# Patient Record
Sex: Male | Born: 1950 | Race: Black or African American | Hispanic: No | Marital: Married | Smoking: Current every day smoker
Health system: Southern US, Community
[De-identification: ages and names within clinical notes are randomized; demographics above are authoritative.]

## PROBLEM LIST (undated history)

## (undated) DIAGNOSIS — I10 Essential (primary) hypertension: Secondary | ICD-10-CM

## (undated) DIAGNOSIS — R918 Other nonspecific abnormal finding of lung field: Secondary | ICD-10-CM

## (undated) DIAGNOSIS — F4310 Post-traumatic stress disorder, unspecified: Secondary | ICD-10-CM

## (undated) DIAGNOSIS — J439 Emphysema, unspecified: Secondary | ICD-10-CM

## (undated) DIAGNOSIS — F419 Anxiety disorder, unspecified: Secondary | ICD-10-CM

## (undated) DIAGNOSIS — E7800 Pure hypercholesterolemia, unspecified: Secondary | ICD-10-CM

## (undated) HISTORY — DX: Emphysema, unspecified: J43.9

## (undated) HISTORY — DX: Anxiety disorder, unspecified: F41.9

## (undated) HISTORY — PX: ESOPHAGOGASTRODUODENOSCOPY: SHX1529

## (undated) HISTORY — DX: Post-traumatic stress disorder, unspecified: F43.10

## (undated) HISTORY — DX: Other nonspecific abnormal finding of lung field: R91.8

## (undated) HISTORY — DX: Essential (primary) hypertension: I10

## (undated) HISTORY — DX: Pure hypercholesterolemia, unspecified: E78.00

## (undated) HISTORY — PX: COLONOSCOPY: SHX174

---

## 1983-11-21 HISTORY — PX: TIBIA FRACTURE SURGERY: SHX806

## 2002-06-17 ENCOUNTER — Encounter: Payer: Self-pay | Admitting: Internal Medicine

## 2002-06-17 ENCOUNTER — Ambulatory Visit (HOSPITAL_COMMUNITY): Admission: RE | Admit: 2002-06-17 | Discharge: 2002-06-17 | Payer: Self-pay | Admitting: Internal Medicine

## 2003-12-04 ENCOUNTER — Ambulatory Visit (HOSPITAL_COMMUNITY): Admission: RE | Admit: 2003-12-04 | Discharge: 2003-12-04 | Payer: Self-pay | Admitting: Gastroenterology

## 2003-12-18 ENCOUNTER — Encounter: Admission: RE | Admit: 2003-12-18 | Discharge: 2003-12-18 | Payer: Self-pay | Admitting: Gastroenterology

## 2013-11-02 ENCOUNTER — Encounter: Payer: Self-pay | Admitting: Nurse Practitioner

## 2013-11-02 NOTE — Progress Notes (Signed)
This encounter was created in error - please disregard.

## 2015-08-27 ENCOUNTER — Telehealth: Payer: Self-pay | Admitting: Pulmonary Disease

## 2015-08-27 ENCOUNTER — Encounter: Payer: Self-pay | Admitting: Pulmonary Disease

## 2015-08-27 ENCOUNTER — Ambulatory Visit (INDEPENDENT_AMBULATORY_CARE_PROVIDER_SITE_OTHER): Payer: Medicare Other | Admitting: Pulmonary Disease

## 2015-08-27 ENCOUNTER — Other Ambulatory Visit: Payer: Medicare Other

## 2015-08-27 VITALS — BP 138/84 | HR 77 | Ht 69.0 in | Wt 116.0 lb

## 2015-08-27 DIAGNOSIS — J438 Other emphysema: Secondary | ICD-10-CM

## 2015-08-27 DIAGNOSIS — F411 Generalized anxiety disorder: Secondary | ICD-10-CM | POA: Insufficient documentation

## 2015-08-27 DIAGNOSIS — F17200 Nicotine dependence, unspecified, uncomplicated: Secondary | ICD-10-CM

## 2015-08-27 DIAGNOSIS — I10 Essential (primary) hypertension: Secondary | ICD-10-CM | POA: Insufficient documentation

## 2015-08-27 DIAGNOSIS — R918 Other nonspecific abnormal finding of lung field: Secondary | ICD-10-CM | POA: Insufficient documentation

## 2015-08-27 DIAGNOSIS — J441 Chronic obstructive pulmonary disease with (acute) exacerbation: Secondary | ICD-10-CM

## 2015-08-27 DIAGNOSIS — E785 Hyperlipidemia, unspecified: Secondary | ICD-10-CM | POA: Insufficient documentation

## 2015-08-27 DIAGNOSIS — J439 Emphysema, unspecified: Secondary | ICD-10-CM | POA: Insufficient documentation

## 2015-08-27 DIAGNOSIS — F4310 Post-traumatic stress disorder, unspecified: Secondary | ICD-10-CM | POA: Insufficient documentation

## 2015-08-27 NOTE — Progress Notes (Signed)
Subjective:    Patient ID: Steven Ayers, male    DOB: 1951/01/21, 64 y.o.   MRN: [id number]  HPI He reports he had a routine physical on 07/21/15 and was found to have hematuria. He reports that during the workup he had a CT scan of his chest that showed lung nodules as well as cystic/emphysematous changes. No known TB exposure. He did do a tour of duty in [place]. He denies any noticeable dyspnea on exertion. He reports intermittent coughing productive of a "yellowish" phlegm. He denies any hemoptysis. He produces a couple of tablespoons of phlegm daily. He denies any wheezing. He denies any fever, chills, or sweats. He reports he has been at his current weight for 8-9 years. Denies any chest pain or pressure. No adenopathy in his neck, groin, or axilla recently. No sinus congestion, pressure, or drainage. He denies any illnesses when in the [facility] and was aware of the concern for [facility] Fever while he was there for 7 years.   Review of Systems No rashes or abnormal bruising. He denies any joint swelling, erythema, or stiffness. A pertinent 14 point review of systems is negative except as per the history of presenting illness.  No Known Allergies  No current outpatient prescriptions on file prior to visit.   No current facility-administered medications on file prior to visit.   Past Medical History  Diagnosis Date  . Hypertension   . Emphysema, unspecified ([place])   . High cholesterol   . Multiple lung nodules on CT   . Emphysema of lung ([place])   . Anxiety   . PTSD (post-traumatic stress disorder)     Past Surgical History  Procedure Laterality Date  . Tibia fracture surgery Right 1985  . Colonoscopy    . Esophagogastroduodenoscopy      Family History  Problem Relation Age of Onset  . Heart disease Brother   . Heart attack Brother   . Heart disease Paternal Grandmother   . Cancer Father     prostate  . Heart attack Father   . Cancer Maternal Aunt     breast  . Lung  disease Neg Hx    Social History   Social History  . Marital Status: Married    Spouse Name: N/A  . Number of Children: N/A  . Years of Education: N/A   Social History Main Topics  . Smoking status: Current Every Day Smoker -- 0.25 packs/day for 45 years    Types: Cigarettes    Start date: 10/14/1969  . Smokeless tobacco: None     Comment: Peak rate of 1ppd  . Alcohol Use: 0.0 oz/week    0 Standard drinks or equivalent per week     Comment: Rare social EtOH - 1 beer in last 6 months  . Drug Use: No  . Sexual Activity: Not Asked   Other Topics Concern  . None   Social History Narrative   Originally from near [place], [place]. He served in [employer] for 24 years. He was in [place] & [place]. No other international travel. 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 He has lived in [place], [place], [place], [place], [place], & [place]. No pets currently. No bird exposure. He had mold in his home that was professionally removed last year from his bedroom & near his bedroom windows. He reports they just used liquids to treat but didn't have  any invasive investigation. He denies any musty or damp smell recently. No hot tub exposure. He enjoys doing walks & doing activities with his church. 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      Objective:   Physical Exam Blood pressure 138/84, pulse 77, height 5\' 9"  (1.753 m), weight 116 lb (52.617 kg), SpO2 98 %. General:  Awake. Alert. No acute distress. Thin, African-American male.  Integument:  Warm & dry. No rash on exposed skin. No bruising. Lymphatics:  No appreciated cervical or supraclavicular lymphadenoapthy. HEENT:  Moist mucus membranes. No oral ulcers. No scleral injection or icterus. PERRL. Minimal bilateral nasal turbinate swelling. Cardiovascular:  Regular rate. No edema. No appreciable JVD.  Pulmonary:  Good  aeration & clear to auscultation bilaterally. Symmetric chest wall expansion. No accessory muscle use. Abdomen: Soft. Normal bowel sounds. Nondistended. Grossly nontender. Musculoskeletal:  Normal bulk and tone. Hand grip strength 5/5 bilaterally. No joint deformity or effusion appreciated. Neurological:  CN 2-12 grossly in tact. No meningismus. Moving all 4 extremities equally. Symmetric brachioradialis deep tendon reflexes. Psychiatric:  Mood and affect congruent. Speech normal rhythm, rate & tone.   IMAGING CT CHEST W/ 08/23/15 (personally reviewed by me): Extensive bilateral emphysematous changes with bleb formation. Bilateral sub-centimeter nodules noted. Thick walled bullae versus cyst in right upper lobe with adjacent opacity. No evidence for mycetoma. No pleural effusion or thickening. No pathologic mediastinal adenopathy appreciated. No evidence for chest wall invasion. No pericardial effusion.    Assessment & Plan:  64 year old male with significant emphysema as well as some thickening of the wall and a right upper lobe lab suspicious for an infectious component to the pulmonary nodules noted on his CT scan. Given his prior residence in [place] as well as the [place] fungal infections such as Blastomycosis, Histoplasma, & Coccidioides are of concern. I am less suspicious about reactivation tuberculosis given no notable prior exposure, but the patient did live in [place] during a tour of duty. We discussed his ongoing tobacco use at length as well as the need for complete cessation to prevent further injury to his lungs. I do believe it's reasonable to screen him for alpha-1 antitrypsin deficiency as well as probable underlying COPD given his chronic cough. I instructed the patient to contact my office if you have any further questions or concerns before his next appointment.  1. Bilateral pulmonary nodules: Plan to repeat CT scan of the chest without contrast in 4-6 weeks. 2. Emphysema:  Screening for alpha-1 antitrypsin deficiency. Checking full pulmonary function testing & 6 minute walk test. 3. Ongoing tobacco use: Patient counseled for over 3 minutes for complete tobacco cessation. 4. Cough: Checking sputum culture for AFB, fungus, and bacteria. Checking urine Histoplasma & Blastomyces antigens. Checking serum QuantiFERON-TB along with Coccidioides antibodies. Holding on antibiotic therapy at this time. 5. Patient to return to clinic in 4-6 weeks or sooner if needed.

## 2015-08-27 NOTE — Patient Instructions (Signed)
1. I'm doing blood and urine tests today to determine whether or not there may be a fungal infection contributing to the nodule seen on your CT scan. 2. We are checking you for the genetic form of COPD & emphysema today. 3. We will schedule breathing tests and a walking test patch her return visit to screen you for COPD. 4. I'm checking a culture of your sputum to see if we can identify what organism could be contributing to what I am seeing on her CT scan. 5. You will return to clinic in 4-6 weeks but please call my office if you have any further questions or concerns.

## 2015-08-27 NOTE — Addendum Note (Signed)
Addended by: Jerrol Banana on: 08/27/2015 03:46 PM   Modules accepted: Orders

## 2015-08-30 LAB — QUANTIFERON TB GOLD ASSAY (BLOOD)
INTERFERON GAMMA RELEASE ASSAY: NEGATIVE
QUANTIFERON NIL VALUE: 0.03 [IU]/mL
Quantiferon Tb Ag Minus Nil Value: 0.03 IU/mL
TB Ag value: 0.06 IU/mL

## 2015-08-30 LAB — HISTOPLASMA ANTIGEN, URINE

## 2015-08-30 LAB — COCCIDIOIDES ANTIBODIES: Coccidioides Ab CF: <1:2 {titer}

## 2015-08-30 NOTE — Telephone Encounter (Signed)
Called pt and appt scheduled for 12/9 w/ Dr. Ashok Cordia.

## 2015-08-30 NOTE — Telephone Encounter (Signed)
Called pt and ppt scheduled for 11/17

## 2015-08-30 NOTE — Telephone Encounter (Signed)
Can you wiggle him into the November schedule for 4-6 weeks? Thanks.

## 2015-08-31 ENCOUNTER — Other Ambulatory Visit: Payer: Medicare Other

## 2015-08-31 ENCOUNTER — Other Ambulatory Visit: Payer: Self-pay | Admitting: Pulmonary Disease

## 2015-08-31 DIAGNOSIS — J438 Other emphysema: Secondary | ICD-10-CM

## 2015-08-31 DIAGNOSIS — F17200 Nicotine dependence, unspecified, uncomplicated: Secondary | ICD-10-CM

## 2015-08-31 DIAGNOSIS — R042 Hemoptysis: Secondary | ICD-10-CM

## 2015-09-01 ENCOUNTER — Telehealth: Payer: Self-pay | Admitting: *Deleted

## 2015-09-01 LAB — ALPHA-1 ANTITRYPSIN PHENOTYPE: A1 ANTITRYPSIN: 179 mg/dL (ref 83–199)

## 2015-09-01 NOTE — Telephone Encounter (Signed)
[facility] called reports AFB showed 2+ positive.

## 2015-09-02 ENCOUNTER — Telehealth: Payer: Self-pay | Admitting: Pulmonary Disease

## 2015-09-02 LAB — RESPIRATORY CULTURE OR RESPIRATORY AND SPUTUM CULTURE
CULTURE: NORMAL
ORGANISM ID, BACTERIA: NORMAL

## 2015-09-02 NOTE — Telephone Encounter (Signed)
Called and spoke with [name] with [facility] Department she stated that she needed to discuss pt's critical lab results. I informed her that Dr. Ashok Cordia was out of the office and would return to the office on 10.19.2016. I explained to her that he is aware of results but would contact her when he was available. Mindy is aware of message.  Durene Cal [name] is requesting to speak with you at [phone] or [phone]

## 2015-09-02 NOTE — Telephone Encounter (Signed)
[name] returned call  [phone]

## 2015-09-02 NOTE — Telephone Encounter (Signed)
LVM on [phone] and [phone] for Steven Ayers with [facility] Department to return our call.

## 2015-09-03 ENCOUNTER — Telehealth: Payer: Self-pay | Admitting: Pulmonary Disease

## 2015-09-03 NOTE — Telephone Encounter (Signed)
Contacted by the [facility] regarding sputum AFB positivity. Explained to the representative that the patient's QuantiFERON TB is negative. Unlikely to represent tuberculosis especially without hemoptysis.

## 2015-09-06 NOTE — Telephone Encounter (Signed)
Was there a reason that this was sent back to me? I already spoke with her and documented. JN

## 2015-09-07 NOTE — Telephone Encounter (Signed)
Per 10.14.2016 phone note Steven Ayers has already spoken to the health dept. Nothing further needed at this time.

## 2015-09-10 ENCOUNTER — Telehealth: Payer: Self-pay | Admitting: Pulmonary Disease

## 2015-09-10 DIAGNOSIS — A310 Pulmonary mycobacterial infection: Secondary | ICD-10-CM

## 2015-09-10 NOTE — Telephone Encounter (Signed)
Received a critical call from lab for pt Steven Ayers stated that pt is positive for AFB and MAC Results are being faxed to office  Dr Ashok Cordia, please advise

## 2015-09-10 NOTE — Telephone Encounter (Signed)
Pt notified. Orders for labs entered. Nothing further needed.

## 2015-09-10 NOTE — Telephone Encounter (Signed)
Await sensitivities. Need to repeat a sputum culture for AFB, Fungus, & Bacteria. JN

## 2015-09-14 LAB — MVISTA BLASTOMYCES QNT AG, URINE
INTERPRETATION - MVISTA BLASTOMYCES QNT AG: NEGATIVE
Result (ng/ml): NOT DETECTED ng/mL

## 2015-09-28 LAB — FUNGUS CULTURE W SMEAR: SMEAR RESULT: NONE SEEN

## 2015-09-29 ENCOUNTER — Ambulatory Visit (INDEPENDENT_AMBULATORY_CARE_PROVIDER_SITE_OTHER): Payer: Medicare Other | Admitting: Pulmonary Disease

## 2015-09-29 DIAGNOSIS — J441 Chronic obstructive pulmonary disease with (acute) exacerbation: Secondary | ICD-10-CM

## 2015-09-29 DIAGNOSIS — R0600 Dyspnea, unspecified: Secondary | ICD-10-CM

## 2015-09-29 DIAGNOSIS — F17200 Nicotine dependence, unspecified, uncomplicated: Secondary | ICD-10-CM

## 2015-09-29 LAB — PULMONARY FUNCTION TEST
DL/VA % PRED: 67 %
DL/VA: 3.09 ml/min/mmHg/L
DLCO UNC % PRED: 39 %
DLCO unc: 12.63 ml/min/mmHg
FEF 25-75 Post: 1.98 L/sec
FEF 25-75 Pre: 1.07 L/sec
FEF2575-%CHANGE-POST: 84 %
FEF2575-%PRED-POST: 75 %
FEF2575-%PRED-PRE: 41 %
FEV1-%Change-Post: 20 %
FEV1-%PRED-POST: 86 %
FEV1-%Pred-Pre: 71 %
FEV1-PRE: 2.09 L
FEV1-Post: 2.52 L
FEV1FVC-%CHANGE-POST: 14 %
FEV1FVC-%Pred-Pre: 80 %
FEV6-%Change-Post: 5 %
FEV6-%PRED-PRE: 92 %
FEV6-%Pred-Post: 96 %
FEV6-PRE: 3.38 L
FEV6-Post: 3.55 L
FEV6FVC-%Change-Post: 0 %
FEV6FVC-%Pred-Post: 104 %
FEV6FVC-%Pred-Pre: 104 %
FVC-%CHANGE-POST: 4 %
FVC-%PRED-POST: 92 %
FVC-%PRED-PRE: 88 %
FVC-POST: 3.55 L
FVC-PRE: 3.39 L
POST FEV1/FVC RATIO: 71 %
POST FEV6/FVC RATIO: 100 %
PRE FEV1/FVC RATIO: 62 %
Pre FEV6/FVC Ratio: 100 %
RV % pred: 109 %
RV: 2.57 L
TLC % pred: 84 %
TLC: 5.84 L

## 2015-09-29 NOTE — Progress Notes (Signed)
PFT done today. 

## 2015-09-29 NOTE — Progress Notes (Signed)
PFT 09/29/15: FVC 3.39 L (88%) FEV1 2.09 L (71%) FEV1/FVC 0.62 FEF 25-75 1.07 L (41%) positive bronchodilator response TLC 5.84 L (84%) RV 109% ERV 264% DLCO uncorrected 39%  6MWT 09/29/15:  Walked 396 meters / Baseline Sat 96% on Ra / Nadir Sat 96% on RA @ rest  MICROBIOLOGY Sputum (08/31/15):  Mycobacerium Avium-Intracellulare Complex / Fungus negative / Oral Flora Urine Histoplasma Ag (08/27/15):  <0.5 Urine Blastomyces Ag (08/27/15):  Negative Coccidioides Antibodies:  <1:2 Quantiferon-TB:  Negative  LABS 08/27/15 Alpha-1 AT:  MM (179)

## 2015-09-30 ENCOUNTER — Ambulatory Visit (INDEPENDENT_AMBULATORY_CARE_PROVIDER_SITE_OTHER)
Admission: RE | Admit: 2015-09-30 | Discharge: 2015-09-30 | Disposition: A | Discharge status: Home / Self Care | Payer: Medicare Other | Source: Ambulatory Visit | Attending: Pulmonary Disease | Admitting: Pulmonary Disease

## 2015-09-30 DIAGNOSIS — F17200 Nicotine dependence, unspecified, uncomplicated: Secondary | ICD-10-CM | POA: Diagnosis not present

## 2015-09-30 DIAGNOSIS — R918 Other nonspecific abnormal finding of lung field: Secondary | ICD-10-CM

## 2015-10-07 ENCOUNTER — Ambulatory Visit: Payer: Medicare Other | Admitting: Pulmonary Disease

## 2015-10-07 ENCOUNTER — Telehealth: Payer: Self-pay | Admitting: Pulmonary Disease

## 2015-10-07 NOTE — Telephone Encounter (Signed)
Pt notified of results of cultures, PFTs, 6MWT, lab tests, & CT results. Plan to keep appointment @ 3pm on 12/9. Will; cancel his appointment for today. He reports he has minimal cough that is nonproductive at this time. He will notify me if his cough returns or he has any wheezing.

## 2015-10-15 LAB — AFB CULTURE WITH SMEAR (NOT AT ARMC)

## 2015-10-29 ENCOUNTER — Encounter: Payer: Self-pay | Admitting: Pulmonary Disease

## 2015-10-29 ENCOUNTER — Ambulatory Visit (INDEPENDENT_AMBULATORY_CARE_PROVIDER_SITE_OTHER): Payer: Medicare Other | Admitting: Pulmonary Disease

## 2015-10-29 VITALS — BP 110/70 | HR 102 | Ht 69.0 in | Wt 116.2 lb

## 2015-10-29 DIAGNOSIS — R918 Other nonspecific abnormal finding of lung field: Secondary | ICD-10-CM | POA: Diagnosis not present

## 2015-10-29 DIAGNOSIS — J449 Chronic obstructive pulmonary disease, unspecified: Secondary | ICD-10-CM

## 2015-10-29 DIAGNOSIS — J189 Pneumonia, unspecified organism: Secondary | ICD-10-CM

## 2015-10-29 DIAGNOSIS — F17200 Nicotine dependence, unspecified, uncomplicated: Secondary | ICD-10-CM | POA: Diagnosis not present

## 2015-10-29 DIAGNOSIS — J984 Other disorders of lung: Secondary | ICD-10-CM

## 2015-10-29 NOTE — Patient Instructions (Signed)
1. Please call me if your cough worsens in anyway or you experience any new symptoms. 2. We will repeat breathing tests at your next appointment with me. 3. I will see you back in 3-4 months or sooner if needed.

## 2015-10-29 NOTE — Progress Notes (Signed)
Subjective:    Patient ID: Steven Ayers, male    DOB: 07-06-1951, 64 y.o.   MRN: [id number]  C.C.:  Follow-up for Mild COPD w/ Emphysema, Pulmonary Nodules & Right Upper Lobe Cavity, & Ongoing Tobacco Use.   HPI Mild COPD w/ Emphysema: Alpha-1 antitrypsin was negative. Spirometry did show mild airways obstruction with a significant bronchodilator response however. No wheezing. Denies any significant dyspnea except with exertion.   Pulmonary Nodules & Right Upper Lobe Cavity: Sputum cultures yielded atypical mycobacteria x1. Cough had previously resolved. Denies any hemoptysis. Minimally productive of a yellow mucus now. Seen initially on CT imaging 08/2015 without evidence of progression on repeat imaging 4 weeks later.  Ongoing Tobacco Use:  He continues to try to cut back completely. He is smoking 1-2 cigarettes per day.  Review of Systems No chest pain or pressure. No fever, chills, or sweats.   No Known Allergies  Current Outpatient Prescriptions on File Prior to Visit  Medication Sig Dispense Refill  . aspirin 81 MG tablet Take 81 mg by mouth daily.    . BUPROPION HCL PO Take 1 tablet by mouth daily.    . fenofibrate (TRIGLIDE) 50 MG tablet Take 50 mg by mouth daily.    Marland Kitchen losartan (COZAAR) 100 MG tablet Take 100 mg by mouth daily.     No current facility-administered medications on file prior to visit.   Past Medical History  Diagnosis Date  . Hypertension   . Emphysema, unspecified ([place])   . High cholesterol   . Multiple lung nodules on CT   . Emphysema of lung ([place])   . Anxiety   . PTSD (post-traumatic stress disorder)     Past Surgical History  Procedure Laterality Date  . Tibia fracture surgery Right 1985  . Colonoscopy    . Esophagogastroduodenoscopy      Family History  Problem Relation Age of Onset  . Heart disease Brother   . Heart attack Brother   . Heart disease Paternal Grandmother   . Cancer Father     prostate  . Heart attack Father   .  Cancer Maternal Aunt     breast  . Lung disease Neg Hx    Social History   Social History  . Marital Status: Married    Spouse Name: N/A  . Number of Children: N/A  . Years of Education: N/A   Social History Main Topics  . Smoking status: Current Every Day Smoker -- 0.25 packs/day for 45 years    Types: Cigarettes    Start date: 10/14/1969  . Smokeless tobacco: None     Comment: Peak rate of 1ppd  . Alcohol Use: 0.0 oz/week    0 Standard drinks or equivalent per week     Comment: Rare social EtOH - 1 beer in last 6 months  . Drug Use: No  . Sexual Activity: Not Asked   Other Topics Concern  . None   Social History Narrative   Originally from near [place], [place]. He served in [employer] for 24 years. He was in [place] & [place]. No other international travel. 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 He has lived in [place], [place], [place], [place], [place], & [place]. No pets currently. No bird exposure. He had mold in his home that was professionally removed last year from his bedroom & near his bedroom windows. He reports they  just used liquids to treat but didn't have any invasive investigation. He denies any musty or damp smell recently. No hot tub exposure. He enjoys doing walks & doing activities with his church. 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      Objective:   Physical Exam BP 110/70 mmHg  Pulse 102  Ht 5\' 9"  (1.753 m)  Wt 116 lb 3.2 oz (52.708 kg)  BMI 17.15 kg/m2  SpO2 98% General:  Thin, African-American male. No distress. Appears comfortable sitting in chair. Integument:  Warm & dry. No rash on exposed skin. No bruising on exposed skin. HEENT:  Moist mucus membranes. No oral ulcers. Mild bilateral nasal turbinate swelling. Cardiovascular:  Regular rate. No edema. Normal S1 & S2.  Pulmonary:  Clear bilaterally to auscultation. Symmetric  chest wall expansion. No accessory muscle use on room air. Abdomen: Soft. Normal bowel sounds. Nondistended. Grossly nontender. Musculoskeletal:  Normal bulk and tone. Hand grip strength 5/5 bilaterally. No joint deformity or effusion appreciated.  PFT 09/29/15: FVC 3.39 L (88%) FEV1 2.09 L (71%) FEV1/FVC 0.62 FEF 25-75 1.07 L (41%) positive bronchodilator response TLC 5.84 L (84%) RV 109% ERV 264% DLCO uncorrected 39%  6MWT 09/29/15: Walked 396 meters / Baseline Sat 96% on Ra / Nadir Sat 96% on RA @ rest  IMAGING CT CHEST W/O 09/30/15 (personally reviewed by me): Persistent bilateral emphysematous changes of bleb formation. Bilateral pulmonary nodules unchanged. No pleural effusion or thickening. No pathologic mediastinal adenopathy. Thick walled cavity in right upper lobe is unchanged without evidence of air-fluid level and suspicion for abscess versus infected bulla. Associated pleural thickening unchanged as well.  CT CHEST W/ 08/23/15 (previously reviewed by me): Extensive bilateral emphysematous changes with bleb formation. Bilateral sub-centimeter nodules noted. Thick walled bullae versus cyst in right upper lobe with adjacent opacity. No evidence for mycetoma. No pleural effusion or thickening. No pathologic mediastinal adenopathy appreciated. No evidence for chest wall invasion. No pericardial effusion.  MICROBIOLOGY Sputum (08/31/15): Mycobacerium Avium-Intracellulare Complex / Fungus negative / Oral Flora Urine Histoplasma Ag (08/27/15): <0.5 Urine Blastomyces Ag (08/27/15): Negative Coccidioides Antibodies: <1:2 Quantiferon-TB: Negative  LABS 08/27/15 Alpha-1 AT: MM (179)    Assessment & Plan:  64 year old male with bolus emphysema as well as mild COPD based on spirometry with a significant bronchodilator response. I reviewed the patient's CT imaging from last month as well as lab studies performed and microbiology. The patient has no evidence for alpha-1 antitrypsin  deficiency. It's likely that his COPD and emphysema are caused by his ongoing tobacco use. We did spend over 3 minutes discussing the need for complete tobacco cessation which the patient is attempting with Wellbutrin. The cavitary area in his right upper lobe appears stable. We discussed potential investigation and treatment options including bronchoscopy as well as repeat sputum culture and following with imaging. I explained that I cannot completely rule out the potential for underlying malignancy but this is slightly less likely given lack of progression on imaging. At this time the patient prefers to continue to monitor for symptoms and avoid further aggressive testing or investigation. I instructed the patient contact my office if he had any new breathing problems before his next appointment.  1. Mild COPD with bullous emphysema: Holding on initiating inhaler therapies at this time given lack of symptoms. Repeat spirometry with bronchodilator challenge at next appointment and if worsening consider initiation of inhaled medications. 2. Lung nodules with right upper lobe cavity: Suspicious for atypical infection with MAC  given prior sputum culture result. Patient declines further culture or imaging at this time. Monitoring for worsening or recurrence of symptoms. 3. Tobacco use: Ongoing. Patient taking Wellbutrin. Attempting to quit on his own. Spent over 3 minutes counseling the patient on the need for complete tobacco cessation. 4. Health maintenance: Patient prefers to get the influenza & pneumococcal vaccines through the [place] system. 5. Follow-up: Patient to return to clinic in 3-4 months or sooner if needed.

## 2016-07-20 ENCOUNTER — Other Ambulatory Visit: Payer: Self-pay | Admitting: Internal Medicine

## 2016-07-20 DIAGNOSIS — Z136 Encounter for screening for cardiovascular disorders: Secondary | ICD-10-CM

## 2016-08-02 ENCOUNTER — Ambulatory Visit
Admission: RE | Admit: 2016-08-02 | Discharge: 2016-08-02 | Disposition: A | Discharge status: Home / Self Care | Payer: Medicare Other | Source: Ambulatory Visit | Attending: Internal Medicine | Admitting: Internal Medicine

## 2016-08-02 DIAGNOSIS — Z136 Encounter for screening for cardiovascular disorders: Secondary | ICD-10-CM

## 2016-09-06 ENCOUNTER — Other Ambulatory Visit: Payer: Self-pay | Admitting: Urology

## 2016-09-06 DIAGNOSIS — R972 Elevated prostate specific antigen [PSA]: Secondary | ICD-10-CM

## 2016-10-18 IMAGING — CT CT CHEST W/O CM
2 of 3 series · 15 of 36 positions shown, 18 images · IV contrast (Omnipaque 300)
Comparison: CT of the chest 08/23/2015.

CLINICAL DATA: Abnormal CT of the chest.  Cavitary lesion.

EXAM:
CT CHEST WITHOUT CONTRAST
TECHNIQUE: Multidetector CT imaging of the chest was performed following the
standard protocol without IV contrast.

[Series 2: chest routine with · axial · 0.62mm/px · z∈[-300,-10]mm · 12 of 68 slices shown, 15 images]
[im 5/68  mediastinal]
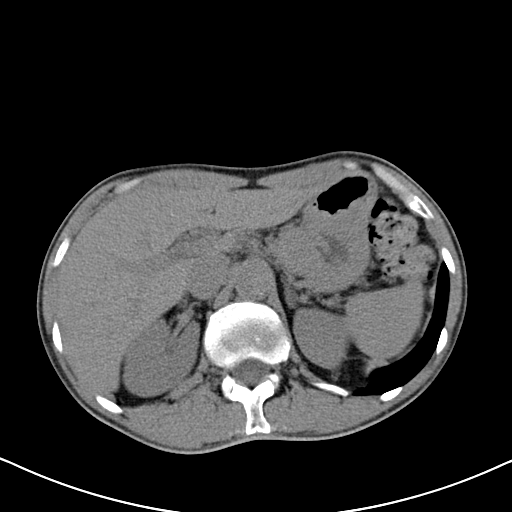
[im 5/68  lung]
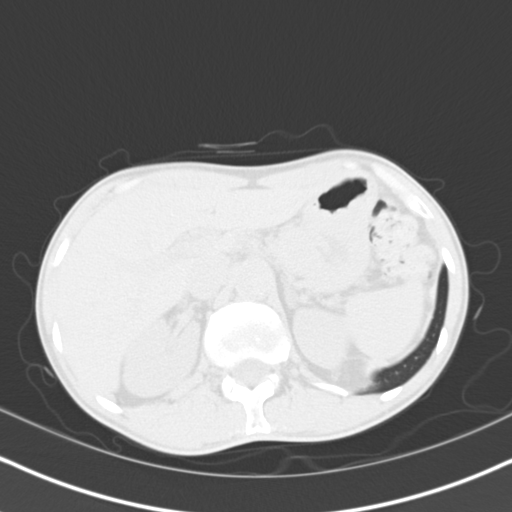
[im 10/68  lung]
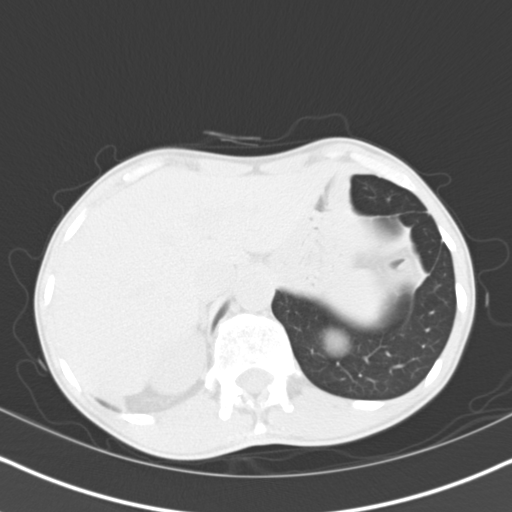
[im 15/68  lung]
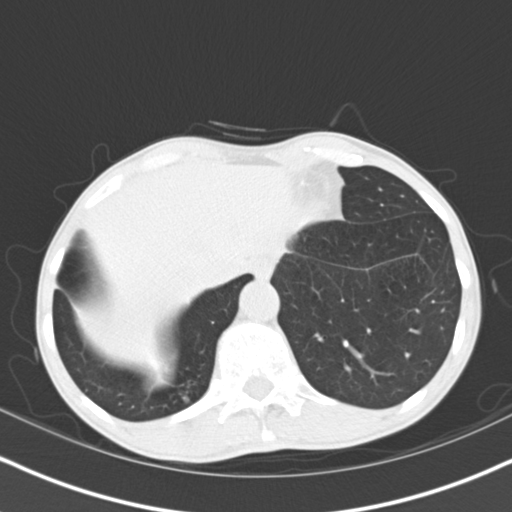
[im 20/68  lung]
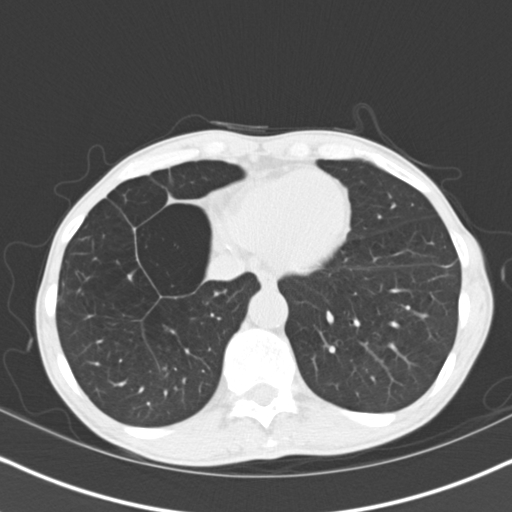
[im 25/68  mediastinal]
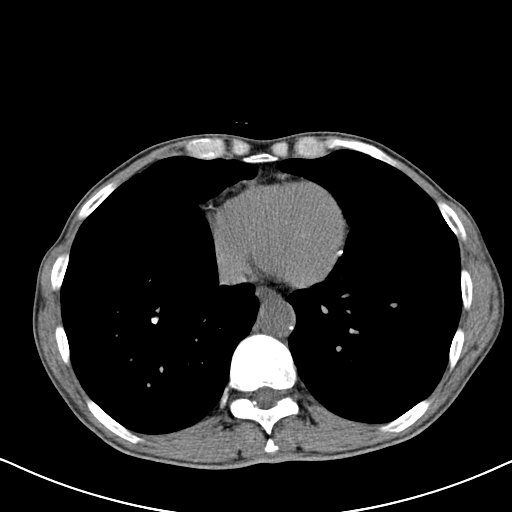
[im 25/68  lung]
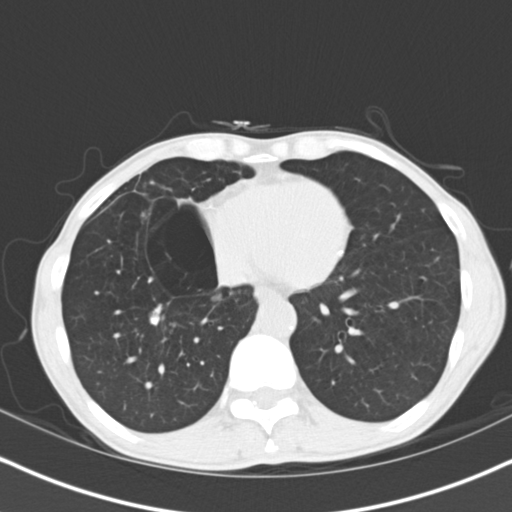
[im 30/68  lung]
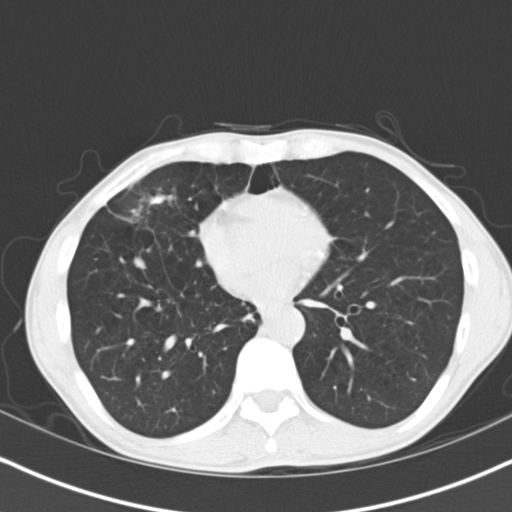
[im 38/68  lung]
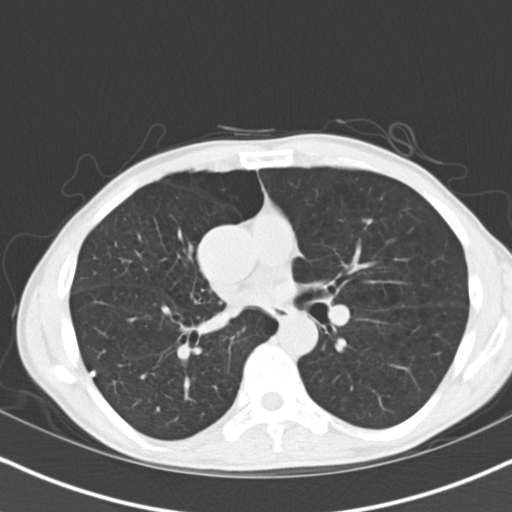
[im 43/68  lung]
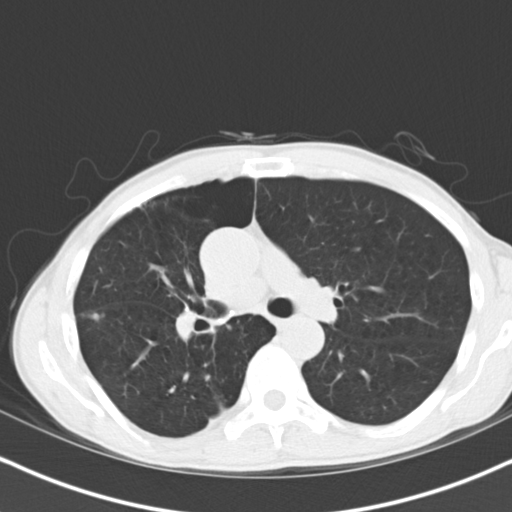
[im 48/68  mediastinal]
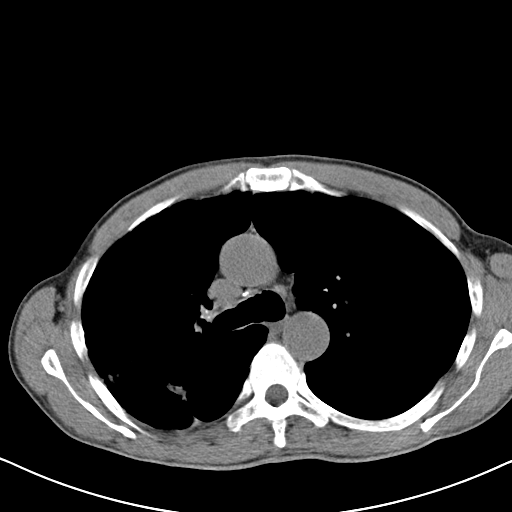
[im 48/68  lung]
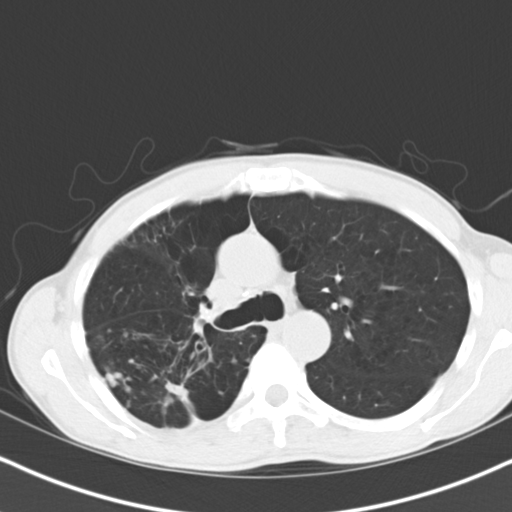
[im 53/68  lung]
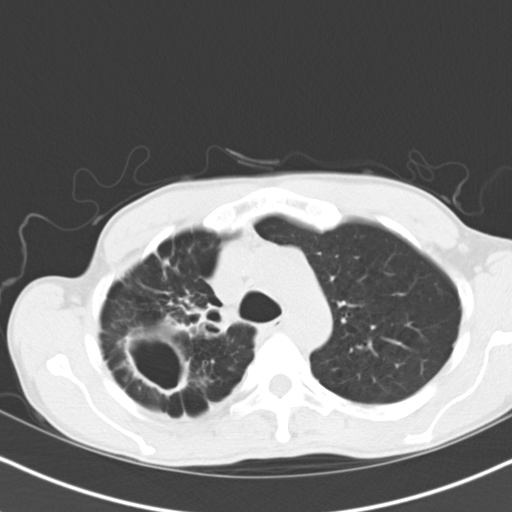
[im 58/68  lung]
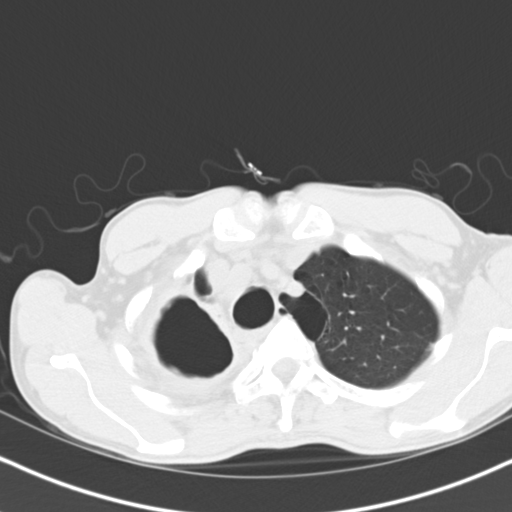
[im 63/68  lung]
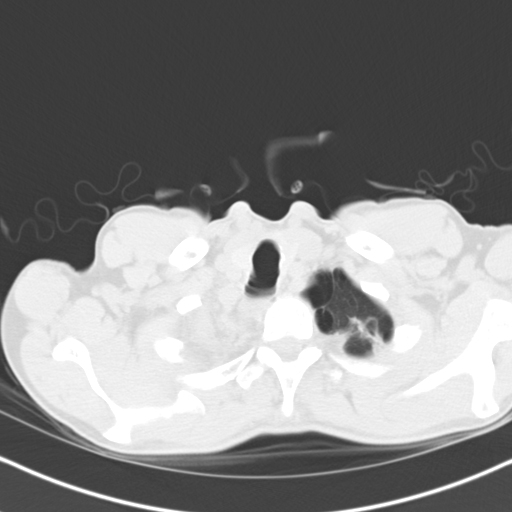

[Series 602: cor · coronal · 0.66mm/px · 3 of 105 slices shown]
[im 21/105  lung]
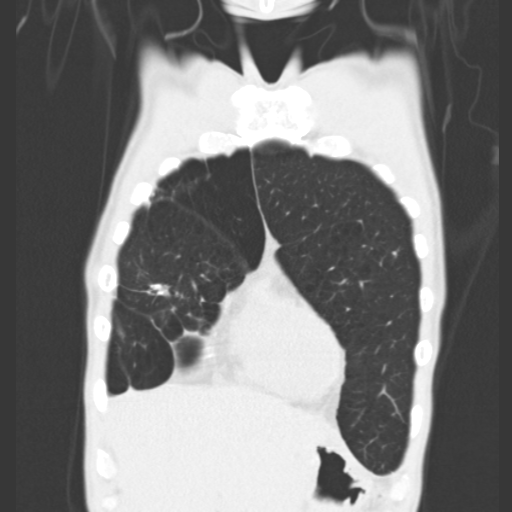
[im 42/105  lung]
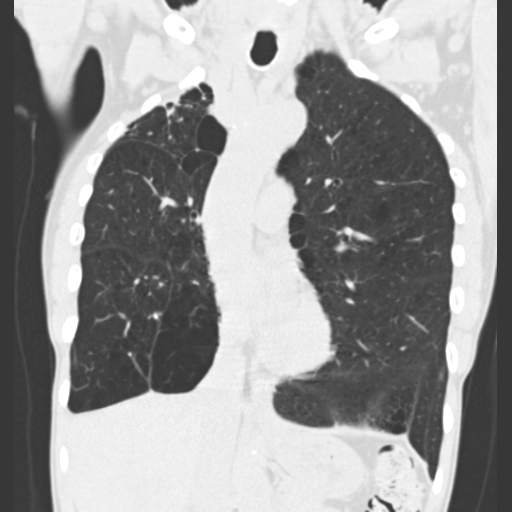
[im 63/105  lung]
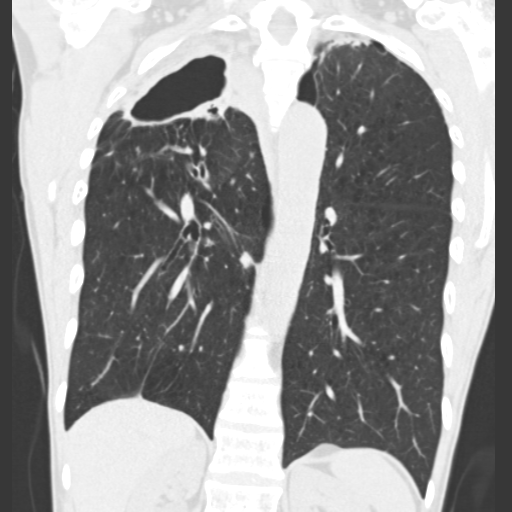

[15 of 36 positions shown; findings below may reference images not displayed]

FINDINGS: Coronary artery calcifications are present. The heart size is
normal. Atherosclerotic changes are also present in the thoracic
aorta without significant change.

No significant adenopathy is present.

The thoracic inlet is within normal limits. The esophagus is
unremarkable. Limited imaging of the upper abdomen is within normal
limits.

The cavitary right upper lobe lesion is stable. This extends into
the superior segment of the right lower lobe. Multiple adjacent
small nodules are also stable, likely post inflammatory. Less
prominent left upper lobe nodules are slightly less conspicuous on
today's exam. No new nodules are present. Centrilobular
emphysematous changes are again noted.

The bone windows are within normal limits.
IMPRESSION: 1. Stable cavitary lesion in the right upper lobe with adjacent
pleural thickening. This is likely postinfectious.
2. Multiple surrounding pulmonary nodules are similar to the prior
exam. These are likely post infectious/inflammatory although ongoing
atypical infection is also considered.
3. Minimal nodularity in the right upper lobe is less conspicuous
than on the prior exam.
4. Neoplasm is considered less likely but not excluded. Recommend
three-month follow-up chest CT without contrast versus bronchoscopy.
5. No evidence for progression of disease.
6. Atherosclerosis including coronary artery disease.

## 2016-10-25 ENCOUNTER — Encounter (HOSPITAL_BASED_OUTPATIENT_CLINIC_OR_DEPARTMENT_OTHER): Payer: Self-pay | Admitting: *Deleted

## 2016-10-25 NOTE — Progress Notes (Signed)
To [facility] at 1015-Istat 8,Ekg on arrival-Instructed Npo after Mn-solids,clear liquids(no dairy,pulp) until 0600-then Npo-refrain from smoking,fleet enema prior to arrival.

## 2016-10-27 ENCOUNTER — Encounter (HOSPITAL_BASED_OUTPATIENT_CLINIC_OR_DEPARTMENT_OTHER)
Admission: RE | Disposition: A | Discharge status: Home / Self Care | Payer: Self-pay | Source: Ambulatory Visit | Attending: Urology

## 2016-10-27 ENCOUNTER — Ambulatory Visit (HOSPITAL_COMMUNITY)
Admission: RE | Admit: 2016-10-27 | Discharge: 2016-10-27 | Disposition: A | Discharge status: Home / Self Care | Payer: Medicare Other | Source: Ambulatory Visit | Attending: Urology | Admitting: Urology

## 2016-10-27 ENCOUNTER — Ambulatory Visit (HOSPITAL_BASED_OUTPATIENT_CLINIC_OR_DEPARTMENT_OTHER): Payer: Medicare Other | Admitting: Anesthesiology

## 2016-10-27 ENCOUNTER — Encounter (HOSPITAL_BASED_OUTPATIENT_CLINIC_OR_DEPARTMENT_OTHER): Payer: Self-pay | Admitting: *Deleted

## 2016-10-27 DIAGNOSIS — Z79899 Other long term (current) drug therapy: Secondary | ICD-10-CM | POA: Insufficient documentation

## 2016-10-27 DIAGNOSIS — F419 Anxiety disorder, unspecified: Secondary | ICD-10-CM | POA: Diagnosis not present

## 2016-10-27 DIAGNOSIS — R3129 Other microscopic hematuria: Secondary | ICD-10-CM | POA: Diagnosis present

## 2016-10-27 DIAGNOSIS — Z803 Family history of malignant neoplasm of breast: Secondary | ICD-10-CM | POA: Diagnosis not present

## 2016-10-27 DIAGNOSIS — Z8249 Family history of ischemic heart disease and other diseases of the circulatory system: Secondary | ICD-10-CM | POA: Diagnosis not present

## 2016-10-27 DIAGNOSIS — R918 Other nonspecific abnormal finding of lung field: Secondary | ICD-10-CM | POA: Diagnosis not present

## 2016-10-27 DIAGNOSIS — R972 Elevated prostate specific antigen [PSA]: Secondary | ICD-10-CM | POA: Insufficient documentation

## 2016-10-27 DIAGNOSIS — E7800 Pure hypercholesterolemia, unspecified: Secondary | ICD-10-CM | POA: Diagnosis not present

## 2016-10-27 DIAGNOSIS — J439 Emphysema, unspecified: Secondary | ICD-10-CM | POA: Diagnosis not present

## 2016-10-27 DIAGNOSIS — N400 Enlarged prostate without lower urinary tract symptoms: Secondary | ICD-10-CM

## 2016-10-27 DIAGNOSIS — F17210 Nicotine dependence, cigarettes, uncomplicated: Secondary | ICD-10-CM | POA: Diagnosis not present

## 2016-10-27 DIAGNOSIS — Z9889 Other specified postprocedural states: Secondary | ICD-10-CM | POA: Insufficient documentation

## 2016-10-27 DIAGNOSIS — F4310 Post-traumatic stress disorder, unspecified: Secondary | ICD-10-CM | POA: Diagnosis not present

## 2016-10-27 DIAGNOSIS — J449 Chronic obstructive pulmonary disease, unspecified: Secondary | ICD-10-CM | POA: Diagnosis not present

## 2016-10-27 DIAGNOSIS — Z7982 Long term (current) use of aspirin: Secondary | ICD-10-CM | POA: Diagnosis not present

## 2016-10-27 DIAGNOSIS — C61 Malignant neoplasm of prostate: Secondary | ICD-10-CM | POA: Diagnosis not present

## 2016-10-27 DIAGNOSIS — I10 Essential (primary) hypertension: Secondary | ICD-10-CM | POA: Diagnosis not present

## 2016-10-27 DIAGNOSIS — Z8042 Family history of malignant neoplasm of prostate: Secondary | ICD-10-CM | POA: Diagnosis not present

## 2016-10-27 HISTORY — PX: CYSTOSCOPY: SHX5120

## 2016-10-27 HISTORY — PX: PROSTATE BIOPSY: SHX241

## 2016-10-27 LAB — POCT I-STAT, CHEM 8
BUN: 23 mg/dL — ABNORMAL HIGH (ref 6–20)
Calcium, Ion: 1.12 mmol/L — ABNORMAL LOW (ref 1.15–1.40)
Chloride: 107 mmol/L (ref 101–111)
Creatinine, Ser: 1.6 mg/dL — ABNORMAL HIGH (ref 0.61–1.24)
GLUCOSE: 63 mg/dL — AB (ref 65–99)
HCT: 44 % (ref 39.0–52.0)
Hemoglobin: 15 g/dL (ref 13.0–17.0)
POTASSIUM: 4.5 mmol/L (ref 3.5–5.1)
Sodium: 138 mmol/L (ref 135–145)
TCO2: 21 mmol/L (ref 0–100)

## 2016-10-27 SURGERY — BIOPSY, PROSTATE, RECTAL APPROACH, WITH US GUIDANCE
Anesthesia: General | Site: Prostate

## 2016-10-27 MED ORDER — LACTATED RINGERS IV SOLN
INTRAVENOUS | Status: DC
  Administered 2016-10-27 (×2): via INTRAVENOUS
  Filled 2016-10-27: qty 1000

## 2016-10-27 MED ORDER — LIDOCAINE 2% (20 MG/ML) 5 ML SYRINGE
INTRAMUSCULAR | Status: DC | PRN
  Administered 2016-10-27: 50 mg via INTRAVENOUS

## 2016-10-27 MED ORDER — PROPOFOL 10 MG/ML IV BOLUS
INTRAVENOUS | Status: DC | PRN
  Administered 2016-10-27: 150 mg via INTRAVENOUS

## 2016-10-27 MED ORDER — OXYCODONE HCL 5 MG/5ML PO SOLN
5.0000 mg | Freq: Once | ORAL | Status: DC | PRN
  Filled 2016-10-27: qty 5

## 2016-10-27 MED ORDER — OXYCODONE HCL 5 MG PO TABS
5.0000 mg | ORAL_TABLET | Freq: Once | ORAL | Status: DC | PRN
  Filled 2016-10-27: qty 1

## 2016-10-27 MED ORDER — PROMETHAZINE HCL 25 MG/ML IJ SOLN
6.2500 mg | INTRAMUSCULAR | Status: DC | PRN
  Filled 2016-10-27: qty 1

## 2016-10-27 MED ORDER — FENTANYL CITRATE (PF) 100 MCG/2ML IJ SOLN
INTRAMUSCULAR | Status: AC
  Filled 2016-10-27: qty 2

## 2016-10-27 MED ORDER — PHENYLEPHRINE 40 MCG/ML (10ML) SYRINGE FOR IV PUSH (FOR BLOOD PRESSURE SUPPORT)
PREFILLED_SYRINGE | INTRAVENOUS | Status: DC | PRN
  Administered 2016-10-27 (×3): 80 ug via INTRAVENOUS

## 2016-10-27 MED ORDER — HYDROMORPHONE HCL 1 MG/ML IJ SOLN
0.2500 mg | INTRAMUSCULAR | Status: DC | PRN
  Filled 2016-10-27: qty 0.5

## 2016-10-27 MED ORDER — PROPOFOL 10 MG/ML IV BOLUS
INTRAVENOUS | Status: AC
  Filled 2016-10-27: qty 20

## 2016-10-27 MED ORDER — MIDAZOLAM HCL 2 MG/2ML IJ SOLN
INTRAMUSCULAR | Status: AC
  Filled 2016-10-27: qty 2

## 2016-10-27 MED ORDER — MIDAZOLAM HCL 5 MG/5ML IJ SOLN
INTRAMUSCULAR | Status: DC | PRN
  Administered 2016-10-27: 2 mg via INTRAVENOUS

## 2016-10-27 MED ORDER — DEXAMETHASONE SODIUM PHOSPHATE 4 MG/ML IJ SOLN
INTRAMUSCULAR | Status: DC | PRN
  Administered 2016-10-27: 10 mg via INTRAVENOUS

## 2016-10-27 MED ORDER — GENTAMICIN IN SALINE 1.6-0.9 MG/ML-% IV SOLN
80.0000 mg | INTRAVENOUS | Status: DC
  Filled 2016-10-27: qty 50

## 2016-10-27 MED ORDER — TRAMADOL HCL 50 MG PO TABS: 50.0000 mg | ORAL_TABLET | Freq: Four times a day (QID) | ORAL | 0 refills | Status: AC | PRN

## 2016-10-27 MED ORDER — DEXAMETHASONE SODIUM PHOSPHATE 10 MG/ML IJ SOLN
INTRAMUSCULAR | Status: AC
  Filled 2016-10-27: qty 1

## 2016-10-27 MED ORDER — ONDANSETRON HCL 4 MG/2ML IJ SOLN
INTRAMUSCULAR | Status: DC | PRN
  Administered 2016-10-27: 4 mg via INTRAVENOUS

## 2016-10-27 MED ORDER — STERILE WATER FOR IRRIGATION IR SOLN
Status: DC | PRN
  Administered 2016-10-27: 3000 mL

## 2016-10-27 MED ORDER — FLEET ENEMA 7-19 GM/118ML RE ENEM
1.0000 | ENEMA | Freq: Once | RECTAL | Status: DC
  Filled 2016-10-27: qty 1

## 2016-10-27 MED ORDER — ONDANSETRON HCL 4 MG/2ML IJ SOLN
INTRAMUSCULAR | Status: AC
  Filled 2016-10-27: qty 2

## 2016-10-27 MED ORDER — LIDOCAINE 2% (20 MG/ML) 5 ML SYRINGE
INTRAMUSCULAR | Status: AC
  Filled 2016-10-27: qty 5

## 2016-10-27 MED ORDER — MEPERIDINE HCL 25 MG/ML IJ SOLN
6.2500 mg | INTRAMUSCULAR | Status: DC | PRN
  Filled 2016-10-27: qty 1

## 2016-10-27 MED ORDER — GENTAMICIN SULFATE 40 MG/ML IJ SOLN
5.0000 mg/kg | INTRAVENOUS | Status: AC
  Administered 2016-10-27: 250 mg via INTRAVENOUS
  Filled 2016-10-27: qty 6.25

## 2016-10-27 SURGICAL SUPPLY — 23 items
BAG DRAIN URO-CYSTO SKYTR STRL (DRAIN) ×4 IMPLANT
CLOTH BEACON ORANGE TIMEOUT ST (SAFETY) ×4 IMPLANT
ELECT REM PT RETURN 9FT ADLT (ELECTROSURGICAL) ×4
ELECTRODE REM PT RTRN 9FT ADLT (ELECTROSURGICAL) ×2 IMPLANT
GLOVE BIO SURGEON STRL SZ7.5 (GLOVE) ×8 IMPLANT
GLOVE BIOGEL PI IND STRL 7.5 (GLOVE) ×2 IMPLANT
GLOVE BIOGEL PI INDICATOR 7.5 (GLOVE) ×2
GOWN STRL REUS W/ TWL LRG LVL3 (GOWN DISPOSABLE) ×4 IMPLANT
GOWN STRL REUS W/TWL LRG LVL3 (GOWN DISPOSABLE) ×4
INST BIOPSY MAXCORE 18GX25 (NEEDLE) ×4 IMPLANT
INSTR BIOPSY MAXCORE 18GX20 (NEEDLE) IMPLANT
KIT ROOM TURNOVER WOR (KITS) ×4 IMPLANT
MANIFOLD NEPTUNE II (INSTRUMENTS) ×4 IMPLANT
NDL SAFETY ECLIPSE 18X1.5 (NEEDLE) IMPLANT
NEEDLE HYPO 18GX1.5 SHARP (NEEDLE)
NEEDLE HYPO 22GX1.5 SAFETY (NEEDLE) IMPLANT
NEEDLE SPNL 22GX7 QUINCKE BK (NEEDLE) ×4 IMPLANT
PACK CYSTO (CUSTOM PROCEDURE TRAY) ×4 IMPLANT
SYR CONTROL 10ML LL (SYRINGE) IMPLANT
TUBE CONNECTING 12'X1/4 (SUCTIONS)
TUBE CONNECTING 12X1/4 (SUCTIONS) IMPLANT
UNDERPAD 30X30 INCONTINENT (UNDERPADS AND DIAPERS) ×4 IMPLANT
WATER STERILE IRR 3000ML UROMA (IV SOLUTION) ×4 IMPLANT

## 2016-10-27 NOTE — Anesthesia Procedure Notes (Signed)
Procedure Name: LMA Insertion Date/Time: 10/27/2016 11:50 AM Performed by: Wanita Chamberlain Pre-anesthesia Checklist: Patient identified, Timeout performed, Emergency Drugs available, Suction available and Patient being monitored Patient Re-evaluated:Patient Re-evaluated prior to inductionOxygen Delivery Method: Circle system utilized Preoxygenation: Pre-oxygenation with 100% oxygen Intubation Type: IV induction Ventilation: Mask ventilation without difficulty LMA: LMA inserted LMA Size: 4.0 Number of attempts: 1 Placement Confirmation: positive ETCO2 and breath sounds checked- equal and bilateral Tube secured with: Tape Dental Injury: Teeth and Oropharynx as per pre-operative assessment

## 2016-10-27 NOTE — Discharge Instructions (Signed)
°  Post Anesthesia Home Care Instructions  Activity: Get plenty of rest for the remainder of the day. A responsible adult should stay with you for 24 hours following the procedure.  For the next 24 hours, DO NOT: -Drive a car -Paediatric nurse -Drink alcoholic beverages -Take any medication unless instructed by your physician -Make any legal decisions or sign important papers.  Meals: Start with liquid foods such as gelatin or soup. Progress to regular foods as tolerated. Avoid greasy, spicy, heavy foods. If nausea and/or vomiting occur, drink only clear liquids until the nausea and/or vomiting subsides. Call your physician if vomiting continues.  Special Instructions/Symptoms: Your throat may feel dry or sore from the anesthesia or the breathing tube placed in your throat during surgery. If this causes discomfort, gargle with warm salt water. The discomfort should disappear within 24 hours.  If you had a scopolamine patch placed behind your ear for the management of post- operative nausea and/or vomiting:  1. The medication in the patch is effective for 72 hours, after which it should be removed.  Wrap patch in a tissue and discard in the trash. Wash hands thoroughly with soap and water. 2. You may remove the patch earlier than 72 hours if you experience unpleasant side effects which may include dry mouth, dizziness or visual disturbances. 3. Avoid touching the patch. Wash your hands with soap and water after contact with the patch.   1 - You may have urinary urgency (bladder spasms) and bloody urine on / off x few days. This is normal. You may have blood / rusty discoloration of semen for few weeks which is also normal.   2 - Call MD or go to ER for fever >102, severe pain / nausea / vomiting not relieved by medications, or acute change in medical status

## 2016-10-27 NOTE — Brief Op Note (Signed)
10/27/2016  12:14 PM  PATIENT:  Steven Ayers  65 y.o. male  PRE-OPERATIVE DIAGNOSIS:  ELEVATED PROSTATIC SPECIFIC ANTIGEN, HEMATURIA  POST-OPERATIVE DIAGNOSIS:  ELEVATED PROSTATIC SPECIFIC ANTIGEN, HEMATURIA  PROCEDURE:  Procedure(s): BIOPSY TRANSRECTAL ULTRASONIC PROSTATE (TUBP) (N/A) CYSTOSCOPY (N/A)  SURGEON:  Surgeon(s) and Role:    * Alexis Frock, MD - Primary  PHYSICIAN ASSISTANT:   ASSISTANTS: none   ANESTHESIA:   general  EBL:  Total I/O In: 600 [I.V.:600] Out: -   BLOOD ADMINISTERED:none  DRAINS: none   LOCAL MEDICATIONS USED:  NONE  SPECIMEN:  Source of Specimen:  12 core prostate biopsies  DISPOSITION OF SPECIMEN:  PATHOLOGY  COUNTS:  YES  TOURNIQUET:  * No tourniquets in log *  DICTATION: .Other Dictation: Dictation Number [id number]  PLAN OF CARE: Discharge to home after PACU  PATIENT DISPOSITION:  PACU - hemodynamically stable.   Delay start of Pharmacological VTE agent (>24hrs) due to surgical blood loss or risk of bleeding: yes

## 2016-10-27 NOTE — Transfer of Care (Signed)
  Last Vitals:  Vitals:   10/27/16 1229 10/27/16 1230  BP: (!) 141/71 (!) 141/76  Pulse: 75 68  Resp: 17 11  Temp:  36.3 C    Last Pain:  Vitals:   10/27/16 1229  TempSrc:   PainSc: Asleep        Immediate Anesthesia Transfer of Care Note  Patient: Steven Ayers  Procedure(s) Performed: Procedure(s) (LRB): BIOPSY TRANSRECTAL ULTRASONIC PROSTATE (TUBP) (N/A) CYSTOSCOPY (N/A)  Patient Location: PACU  Anesthesia Type: General  Level of Consciousness: awake, alert  and oriented  Airway & Oxygen Therapy: Patient Spontanous Breathing and Patient connected to nasal cannula oxygen  Post-op Assessment: Report given to PACU RN and Post -op Vital signs reviewed and stable  Post vital signs: Reviewed and stable  Complications: No apparent anesthesia complications

## 2016-10-27 NOTE — Anesthesia Preprocedure Evaluation (Addendum)
Anesthesia Evaluation  Patient identified by MRN, date of birth, ID band Patient awake    Reviewed: Allergy & Precautions, NPO status , Patient's Chart, lab work & pertinent test results  Airway Mallampati: II  TM Distance: >3 FB Neck ROM: Full    Dental no notable dental hx. (+)    Pulmonary COPD, Current Smoker,    Pulmonary exam normal breath sounds clear to auscultation       Cardiovascular hypertension, Pt. on medications Normal cardiovascular exam Rhythm:Regular Rate:Normal     Neuro/Psych PSYCHIATRIC DISORDERS Anxiety negative neurological ROS     GI/Hepatic negative GI ROS, Neg liver ROS,   Endo/Other  negative endocrine ROS  Renal/GU negative Renal ROS     Musculoskeletal negative musculoskeletal ROS (+)   Abdominal   Peds  Hematology negative hematology ROS (+)   Anesthesia Other Findings   Reproductive/Obstetrics                            Anesthesia Physical Anesthesia Plan  ASA: II  Anesthesia Plan: General   Post-op Pain Management:    Induction: Intravenous  Airway Management Planned: LMA  Additional Equipment:   Intra-op Plan:   Post-operative Plan: Extubation in OR  Informed Consent: I have reviewed the patients History and Physical, chart, labs and discussed the procedure including the risks, benefits and alternatives for the proposed anesthesia with the patient or authorized representative who has indicated his/her understanding and acceptance.   Dental advisory given  Plan Discussed with: CRNA  Anesthesia Plan Comments:         Anesthesia Quick Evaluation

## 2016-10-27 NOTE — H&P (Signed)
Steven Ayers is an 65 y.o. male.    Chief Complaint: Pre-op Cystoscopy and Prostate Biopsy  HPI:    1 - Microscopic Hematuria - blood on UA noted x several 2016. Partial eval with hematuria CT uremarkable. Pt never had cysto done. >50PY smoker still smokes.   2 - Elevated / Rising PSA - He is African American.  07/2015 - PSA 2.8  07/2016 - PSA 4.1 / DRE with Rt lateral induration   PMH sig for COPD/small nodules (follows Tera Partridge MD), Anxiety/PTSD/Benzos, HTN, Small AAA (<3cm, surveillance). He is retired [employer]. His PCP is Willey Blade MD.   Today " Steven Ayers " is seen to proceed with cystoscopy and prostate biopsy to complete hematuria eval and rule out prostate cancer.   Past Medical History:  Diagnosis Date  . Anxiety   . Emphysema of lung ([place])   . Emphysema, unspecified   . High cholesterol   . Hypertension   . Multiple lung nodules on CT   . PTSD (post-traumatic stress disorder)     Past Surgical History:  Procedure Laterality Date  . COLONOSCOPY    . ESOPHAGOGASTRODUODENOSCOPY    . TIBIA FRACTURE SURGERY Right 1985    Family History  Problem Relation Age of Onset  . Cancer Father     prostate  . Heart attack Father   . Heart disease Brother   . Heart attack Brother   . Heart disease Paternal Grandmother   . Cancer Maternal Aunt     breast  . Lung disease Neg Hx    Social History:  reports that he has been smoking Cigarettes.  He started smoking about 47 years ago. He has a 11.25 pack-year smoking history. He has never used smokeless tobacco. He reports that he drinks alcohol. He reports that he does not use drugs.  Allergies: No Known Allergies  No prescriptions prior to admission.    No results found for this or any previous visit (from the past 48 hour(s)). No results found.  Review of Systems  Constitutional: Negative.  Negative for fever.  HENT: Negative.   Eyes: Negative.   Respiratory: Negative.   Cardiovascular: Negative.    Gastrointestinal: Negative.   Genitourinary: Negative.   Musculoskeletal: Negative.   Skin: Negative.   Neurological: Negative.   Endo/Heme/Allergies: Negative.   Psychiatric/Behavioral: Negative.     Height 5\' 9"  (1.753 m), weight 52.6 kg (116 lb). Physical Exam  Constitutional: He appears well-developed.  HENT:  Head: Normocephalic.  Eyes: Pupils are equal, round, and reactive to light.  Neck: Normal range of motion.  Cardiovascular: Normal rate.   Respiratory: Effort normal.  GI: Soft.  Genitourinary:  Genitourinary Comments: No CVAT  Musculoskeletal: Normal range of motion.  Neurological: He is alert.  Skin: Skin is warm.  Psychiatric: He has a normal mood and affect. His behavior is normal. Thought content normal.     Assessment/Plan  Proceed as planned with cysto + prostate biopsy. Risks, benefits, alternatives, expected peri-op course discussed previously and reiterated today.   Alexis Frock, MD 10/27/2016, 6:26 AM

## 2016-10-30 ENCOUNTER — Encounter (HOSPITAL_BASED_OUTPATIENT_CLINIC_OR_DEPARTMENT_OTHER): Payer: Self-pay | Admitting: Urology

## 2016-10-30 NOTE — Op Note (Signed)
NAME:  Steven Ayers, Steven Ayers NO.:  MEDICAL RECORD NO.:  [id number]  LOCATION:                                 FACILITY:  PHYSICIAN:  Alexis Frock, MD          DATE OF BIRTH:  DATE OF PROCEDURE: 10/27/2016                               OPERATIVE REPORT   DIAGNOSIS:  Hematuria, elevated PSA.  PROCEDURES: 1. Cystoscopy. 2. Transrectal ultrasound-guided biopsy of the prostate.  ESTIMATED BLOOD LOSS:  Nil.  COMPLICATION:  None.  SPECIMENS:  Twelve-core prostate biopsy as follows:  Right base lateral, right mid lateral, right apex lateral, right base mid, right mid mid, right mid apex, left base lateral, left mid lateral, left apex lateral, left base mid, left mid mid, left mid apex, all for permanent pathology.  FINDINGS: 1. Unremarkable urinary bladder and urethra. 2. 27 mL volume prostate without focal hypoechoic areas.  INDICATION:  [name] is a pleasant 65 year old gentleman, who was found on evaluation of slowly elevating PSA, also have microscopic hematuria.  He has undergone imaging for his hematuria, which has been unremarkable.  He still needs cystoscopy to complete evaluation.  We discussed options for management of his elevated PSA including surveillance protocols, observation versus prostate biopsy.  Given the constellation of findings, he adamantly wished to proceed with prostate biopsy and cystoscopy under sedation.  Informed consent was obtained and placed in the medical record.  PROCEDURE IN DETAIL:  The patient being [name], was verified. Procedure being cystoscopy and prostate biopsy was confirmed.  Procedure was carried out.  Time-out was performed.  Intravenous antibiotics were administered.  General LMA anesthesia was introduced.  The patient was placed into a low lithotomy position and sterile field was created by prepping and draping the patient's penis, perineum and proximal thighs using iodine.  Next, cystourethroscopy  was performed using a rigid cystoscope with offset lens.  Inspection of the anterior and posterior urethra were unremarkable.  Inspection of the urinary bladder revealed no diverticula, calcifications, papillary lesions.  There was very mild trabeculation noted.  Ureteral orifices were singleton bilaterally. Bladder was emptied per cystoscope.  The patient's foreskin was reduced. Attention was then directed at transrectal ultrasound-guided biopsy of the prostate.  Single transrectal ultrasound was performed.  Representative image was taken of the prostate at the base, mid and apical levels as well as seminal vesicles.  Prostate volume was calculated to be 27 mm.  There were no obvious hypoechoic lesions.  Twelve-core biopsies were then performed with each separate set aside as a separate to Pathology as per above.  Digital rectal exam was performed following prostate biopsy. There was no evidence of pulsatile bleeding or hematoma.  There was stable right lateral prostate induration and the procedure was terminated.  The patient tolerated the procedure well.  There were no immediate periprocedural complications. The patient was taken to the postanesthesia care unit is stable condition.          ______________________________ Alexis Frock, MD     TM/MEDQ  D:  10/27/2016  T:  10/28/2016  Job:  [id number]

## 2016-10-30 NOTE — Anesthesia Postprocedure Evaluation (Signed)
Anesthesia Post Note  Patient: Steven Ayers  Procedure(s) Performed: Procedure(s) (LRB): BIOPSY TRANSRECTAL ULTRASONIC PROSTATE (TUBP) (N/A) CYSTOSCOPY (N/A)  Patient location during evaluation: PACU Anesthesia Type: General Level of consciousness: sedated and patient cooperative Pain management: pain level controlled Vital Signs Assessment: post-procedure vital signs reviewed and stable Respiratory status: spontaneous breathing Cardiovascular status: stable Anesthetic complications: no    Last Vitals:  Vitals:   10/27/16 1315 10/27/16 1348  BP: (!) 146/66 (!) 149/76  Pulse: 66 60  Resp: 15 16  Temp:  36.4 C    Last Pain:  Vitals:   10/27/16 1315  TempSrc:   PainSc: 0-No pain                 Nolon Nations

## 2016-11-09 ENCOUNTER — Encounter: Payer: Self-pay | Admitting: Radiation Oncology

## 2016-11-10 ENCOUNTER — Encounter: Payer: Self-pay | Admitting: Radiation Oncology

## 2016-11-29 NOTE — Progress Notes (Signed)
GU Location of Tumor / Histology: Prostate Cancer  If Prostate Cancer, Gleason Score is (3+3=6) and PSA is (Sept. 2017-4.1 and Sept. 2016-2.8)  Steven Ayers presented months ago with signs/symptoms of: Blood in UA several times since 2016.    Biopsies of prostate (if applicable) revealed:   Past/Anticipated interventions by urology, if any:   Past/Anticipated interventions by medical oncology, if any:   Weight changes, if any: maintaining low weight  Bowel/Bladder complaints, if any: Leakage of urine, increased frequency of urination and nocturia  Nausea/Vomiting, if any: no  Pain issues, if any:  no  SAFETY ISSUES:  Prior radiation? no  Pacemaker/ICD? no  Possible current pregnancy? no  Is the patient on methotrexate? no  Current Complaints / other details: Cigarettes- for 47 years. Smoking 11.25 packs per year. Pt reports a cough-occasionally productive for yellow sputum with no SOB.  He reports depression and anxiety. Married, Psychiatrist.

## 2016-12-04 ENCOUNTER — Ambulatory Visit
Admission: RE | Admit: 2016-12-04 | Discharge: 2016-12-04 | Disposition: A | Discharge status: Home / Self Care | Payer: Medicare Other | Source: Ambulatory Visit | Attending: Radiation Oncology | Admitting: Radiation Oncology

## 2016-12-04 ENCOUNTER — Encounter: Payer: Self-pay | Admitting: Medical Oncology

## 2016-12-04 ENCOUNTER — Encounter: Payer: Self-pay | Admitting: Radiation Oncology

## 2016-12-04 VITALS — BP 142/103 | HR 80 | Temp 98.1°F | Resp 12 | Ht 69.0 in | Wt 114.4 lb

## 2016-12-04 DIAGNOSIS — F4310 Post-traumatic stress disorder, unspecified: Secondary | ICD-10-CM | POA: Insufficient documentation

## 2016-12-04 DIAGNOSIS — R918 Other nonspecific abnormal finding of lung field: Secondary | ICD-10-CM | POA: Insufficient documentation

## 2016-12-04 DIAGNOSIS — F411 Generalized anxiety disorder: Secondary | ICD-10-CM | POA: Diagnosis present

## 2016-12-04 DIAGNOSIS — F17210 Nicotine dependence, cigarettes, uncomplicated: Secondary | ICD-10-CM | POA: Insufficient documentation

## 2016-12-04 DIAGNOSIS — I10 Essential (primary) hypertension: Secondary | ICD-10-CM | POA: Insufficient documentation

## 2016-12-04 DIAGNOSIS — J439 Emphysema, unspecified: Secondary | ICD-10-CM | POA: Diagnosis not present

## 2016-12-04 DIAGNOSIS — Z7982 Long term (current) use of aspirin: Secondary | ICD-10-CM | POA: Diagnosis not present

## 2016-12-04 DIAGNOSIS — R972 Elevated prostate specific antigen [PSA]: Secondary | ICD-10-CM | POA: Diagnosis present

## 2016-12-04 DIAGNOSIS — C61 Malignant neoplasm of prostate: Secondary | ICD-10-CM | POA: Insufficient documentation

## 2016-12-04 DIAGNOSIS — E7800 Pure hypercholesterolemia, unspecified: Secondary | ICD-10-CM | POA: Insufficient documentation

## 2016-12-04 NOTE — Progress Notes (Signed)
Radiation Oncology         [phone] ________________________________  Initial outpatient Consultation  Name: Steven Ayers MRN: [id number]  Date: 12/04/2016  DOB: [birth year]  [id number] R., MD  Alexis Frock, MD   REFERRING PHYSICIAN: Alexis Frock, MD  DIAGNOSIS: 66 y.o. gentleman with stage T1c adenocarcinoma of the prostate with a Gleason's score of 3+3 and a PSA of 4.1    ICD-9-CM ICD-10-CM   1. Anxiety state 300.00 F41.1 Ambulatory referral to Social Work     Ambulatory referral to [place] Steven Ayers is a 66 y.o. gentleman.  He was noted to have microscopic hematuria on several occurrences since 2016 by Dr. Karlton Lemon. He was referred to Dr. Tresa Moore and found to have an elevated PSA of 4.1. Accordingly, he was referred for evaluation in urology by Dr. Tresa Moore on 08/06/16,  digital rectal examination was performed at that time revealing right lateral induration with no nodularity.  The patient proceeded to transrectal ultrasound with 12 biopsies of the prostate on 10/27/16.  The prostate volume measured 27 cc.  Out of 12 core biopsies, 2 were positive.  The maximum Gleason score was 3+3, and this was seen in right base lateral and left apex medial.  The patient reviewed the biopsy results with his urologist and he has kindly been referred today for discussion of potential radiation treatment options.    PREVIOUS RADIATION THERAPY: No  PAST MEDICAL HISTORY:  Past Medical History:  Diagnosis Date  . Anxiety    Reports PTSD  . Emphysema of lung ([place])   . Emphysema, unspecified   . High cholesterol   . Hypertension   . Multiple lung nodules on CT   . PTSD (post-traumatic stress disorder)      PAST SURGICAL HISTORY: Past Surgical History:  Procedure Laterality Date  . COLONOSCOPY    . CYSTOSCOPY N/A 10/27/2016   Procedure: CYSTOSCOPY;  Surgeon: Alexis Frock, MD;  [facility];  Service: Urology;   Laterality: N/A;  . ESOPHAGOGASTRODUODENOSCOPY    . PROSTATE BIOPSY N/A 10/27/2016   Procedure: BIOPSY TRANSRECTAL ULTRASONIC PROSTATE (TUBP);  Surgeon: Alexis Frock, MD;  [facility]: [facility];  Service: Urology;  Laterality: N/A;  . TIBIA FRACTURE SURGERY Right 1985    FAMILY HISTORY:  Family History  Problem Relation Age of Onset  . Cancer Father     prostate  . Heart attack Father   . Heart disease Brother   . Heart attack Brother   . Heart disease Paternal Grandmother   . Cancer Maternal Aunt     breast  . Lung disease Neg Hx     SOCIAL HISTORY:  reports that he has been smoking Cigarettes.  He started smoking about 47 years ago. He has a 11.25 pack-year smoking history. He has never used smokeless tobacco. He reports that he drinks alcohol. He reports that he does not use drugs. The patient is married and resides in [place]. He is retired from working as an Chief Financial Officer for [employer].  ALLERGIES: Patient has no known allergies.  MEDICATIONS:  Current Outpatient Prescriptions  Medication Sig Dispense Refill  . aspirin 81 MG tablet Take 81 mg by mouth daily.    . BUPROPION HCL PO Take 1 tablet by mouth daily.    . fenofibrate (TRIGLIDE) 50 MG tablet Take 50 mg by mouth daily.    Marland Kitchen losartan (COZAAR) 100 MG tablet Take 100 mg by mouth daily.    Marland Kitchen  traMADol (ULTRAM) 50 MG tablet Take 1 tablet (50 mg total) by mouth every 6 (six) hours as needed for moderate pain. Post-operatively. 15 tablet 0   No current facility-administered medications for this encounter.     REVIEW OF SYSTEMS: On review of systems, the patient reports that he is doing well overall. He is slightly anxious about his new diagnosis. He denies any chest pain, shortness of breath, cough, fevers, chills, night sweats, unintended weight changes. He denies any bowel  disturbances, and denies abdominal pain, nausea or vomiting.  The patient completed an IPSS and IIEF questionnaire.  His IPSS score was  12 indicating moderate urinary outflow obstructive symptoms. He is positive for leakage, increased frequency, and nocturia x 2. He indicated that his erectile function is able to complete sexual activity most times. He denies any new musculoskeletal or joint aches or pains. A complete review of systems is obtained and is otherwise negative.    PHYSICAL EXAM:   height is 5\' 9"  (1.753 m) and weight is 114 lb 6.4 oz (51.9 kg). His oral temperature is 98.1 F (36.7 C). His blood pressure is 142/103 (abnormal) and his pulse is 80. His respiration is 12 and oxygen saturation is 99%.    In general this is a well appearing african [place] male in no acute distress. He is alert and oriented x4 and appropriate throughout the examination. HEENT reveals that the patient is normocephalic, atraumatic. EOMs are intact. PERRLA. Skin is intact without any evidence of gross lesions. Cardiovascular exam reveals a regular rate and rhythm, no clicks rubs or murmurs are auscultated. Chest is clear to auscultation bilaterally. Lymphatic assessment is performed and does not reveal any adenopathy in the cervical, supraclavicular, axillary, or inguinal chains. Abdomen has active bowel sounds in all quadrants and is intact. The abdomen is soft, non tender, non distended. Lower extremities are negative for pretibial pitting edema, deep calf tenderness, cyanosis or clubbing.  KPS = 100  100 - Normal; no complaints; no evidence of disease. 90   - Able to carry on normal activity; minor signs or symptoms of disease. 80   - Normal activity with effort; some signs or symptoms of disease. 82   - Cares for self; unable to carry on normal activity or to do active work. 60   - Requires occasional assistance, but is able to care for most of his personal needs. 50   - Requires considerable assistance and frequent medical care. 26   - Disabled; requires special care and assistance. 13   - Severely disabled; hospital admission is  indicated although death not imminent. 36   - Very sick; hospital admission necessary; active supportive treatment necessary. 10   - Moribund; fatal processes progressing rapidly. 0     - Dead  Karnofsky DA, Abelmann Sterrett, Craver LS and Burchenal [facility] [phone]) The use of the nitrogen mustards in the palliative treatment of carcinoma: with particular reference to bronchogenic carcinoma Cancer 1 634-56   LABORATORY DATA:  Lab Results  Component Value Date   HGB 15.0 10/27/2016   HCT 44.0 10/27/2016   Lab Results  Component Value Date   NA 138 10/27/2016   K 4.5 10/27/2016   CL 107 10/27/2016   No results found for: ALT, AST, GGT, ALKPHOS, BILITOT   RADIOGRAPHY: No results found.    IMPRESSION.PLAN: 26.  66 year old gentleman with Low risk, T1c adenocarcinoma of the prostate with a PSA of 4.1, and Gleason Score of 3+3. Dr. Tammi Klippel reviews  the findings of the patient's biopsy specimen, and outlines the natural course of prostate cancer in this setting. He discusses that the patient's  T-Stage, Gleason's Score, and PSA put him into the low risk group.  Accordingly he is eligible for a variety of potential treatment options including active surveillance, prostatectomy, external beam radiation, or radioactive seed implant. We discussed each option comparing and contrasting the treatment and related side effects. We discussed the risks, benefits, short, and long term effects of radiotherapy and the delivery and logistics of these type of treatments. Dr. Tammi Klippel also discusses the risk factor of his race playing a role in how he should be counseled on active surveillance as well. Because of this, Dr. Tammi Klippel would recommend MRI fusion guided biopsy if the patient elects for active surveillance.  The patient will consider his options from our discussion. I have given him my contact information, and I will follow up with him in 1-2 weeks unless he calls [place] sooner.  The above documentation reflects my direct  findings during this shared patient visit. Please see the separate note by Dr. Tammi Klippel on this date for the remainder of the patient's plan of care.     Carola Rhine, PAC      This document serves as a record of services personally performed by Shona Simpson, PA and Tyler Pita, MD. It was created on his behalf by Bethann Humble, a trained medical scribe. The creation of this record is based on the scribe's personal observations and the provider's statements to them. This document has been checked and approved by the attending provider.

## 2016-12-04 NOTE — Addendum Note (Signed)
Encounter addended by: Jenene Slicker, RN on: 12/04/2016  3:13 PM<BR>    Actions taken: Charge Capture section accepted

## 2016-12-14 ENCOUNTER — Encounter: Payer: Self-pay | Admitting: *Deleted

## 2016-12-14 NOTE — Progress Notes (Signed)
[place] Psychosocial Distress Screening Clinical Social Work  Clinical Social Work was referred by distress screening protocol.  The patient scored a 5 on the Psychosocial Distress Thermometer which indicates moderate distress. Clinical Social Worker contacted patient by phone to assess for distress and other psychosocial needs. Steven Ayers shared he has no concerns or issues at this time.  He reported he is no longer anxious or overwhelmed, and plans to discuss his treatment decisions with Dr. Tammi Klippel soon.  CSW encouraged patient to call CSW if any needs arise.  ONCBCN DISTRESS SCREENING 12/04/2016  Screening Type Initial Screening  Distress experienced in past week (1-10) 5  Emotional problem type Depression;Nervousness/Anxiety  Spiritual/Religous concerns type Facing my mortality  Information Concerns Type Lack of info about diagnosis  Physician notified of physical symptoms Yes  Referral to clinical social work Yes  Other Best contact [place], MSW, LCSW, OSW-C Clinical Social Worker [place] [phone]

## 2016-12-28 ENCOUNTER — Telehealth: Payer: Self-pay | Admitting: Radiation Oncology

## 2016-12-28 NOTE — Telephone Encounter (Deleted)
-----   Message from Hayden Pedro, [place] sent at 12/04/2016 12:43 PM EST ----- Regarding: 12/22/16   ----- Message ----- From: Hayden Pedro, PA-C Sent: 12/04/2016  10:37 AM To: Hayden Pedro, PA-C Subject: 12/22/16                                         Check to see active surveillance or seed implant

## 2016-12-28 NOTE — Telephone Encounter (Signed)
I spoke with the patient and he is planning to follow up with his urologist to continue in active surveillance. He will let us know if he changes his mind and wants to move forward with radioactive seed implant.

## 2017-04-12 ENCOUNTER — Telehealth: Payer: Self-pay | Admitting: Radiation Oncology

## 2017-04-13 NOTE — Telephone Encounter (Signed)
The patient's wife had called wanting to know if her husband needed a biopsy in June. We discussed typical timing of biopsies q 12-18 months, but deferred the question to Dr. Tresa Moore as this may be his plan since he's not had the anterior lobe biopsied.

## 2017-05-01 NOTE — Addendum Note (Signed)
Encounter addended by: Heywood Footman, RN on: 05/01/2017  4:01 PM<BR>    Actions taken: Visit Navigator Flowsheet section accepted

## 2018-07-24 ENCOUNTER — Other Ambulatory Visit: Payer: Self-pay | Admitting: Urology

## 2018-07-24 DIAGNOSIS — C61 Malignant neoplasm of prostate: Secondary | ICD-10-CM

## 2019-01-21 ENCOUNTER — Ambulatory Visit
Admission: RE | Admit: 2019-01-21 | Discharge: 2019-01-21 | Disposition: A | Discharge status: Home / Self Care | Payer: Medicare Other | Source: Ambulatory Visit | Attending: Urology | Admitting: Urology

## 2019-01-21 DIAGNOSIS — C61 Malignant neoplasm of prostate: Secondary | ICD-10-CM

## 2019-01-21 MED ORDER — GADOBENATE DIMEGLUMINE 529 MG/ML IV SOLN
10.0000 mL | Freq: Once | INTRAVENOUS | Status: AC | PRN
  Administered 2019-01-21: 10 mL via INTRAVENOUS

## 2020-05-25 ENCOUNTER — Encounter: Payer: Self-pay | Admitting: Medical Oncology

## 2020-05-25 NOTE — Progress Notes (Signed)
Left message with wife to let her know I spoke with Dr. Zettie Pho office and they will contact them with appointments.

## 2020-05-25 NOTE — Progress Notes (Signed)
I spoke with [facility] to let her know patient saw Dr. Tresa Moore in September and he was to be scheduled for PSA, office visit and then a biopsy. Patient has not been contacted with these appointments. Mickel Baas will follow up and give patient a call.

## 2020-05-25 NOTE — Progress Notes (Signed)
Wife called stating her husband has not been scheduled for an appointment with Dr. Tresa Moore and she is concerned. I will follow up and give her a call.

## 2020-05-31 ENCOUNTER — Encounter: Payer: Self-pay | Admitting: Medical Oncology

## 2020-05-31 NOTE — Progress Notes (Signed)
Spoke with patient to inform him, I followed up on appointment with Dr. Tresa Moore. I was informed, they are waiting for a date and time from his nurse. They will contact me and him, once this has been scheduled. He voiced understanding.

## 2020-05-31 NOTE — Progress Notes (Signed)
Theresa-wife called stating they have not been contacted by Dr. Zettie Pho office will an appointment. I will follow up with [facility] and call them back.

## 2020-06-03 ENCOUNTER — Encounter: Payer: Self-pay | Admitting: Medical Oncology

## 2020-06-03 NOTE — Progress Notes (Signed)
Spoke with patient to follow up on appointment with Dr. Tresa Moore, [facility]. He states he did get a call and is scheduled for lab and repeat biopsy. He was appreciative of my help getting his appointment. I asked him to call me if I can assist him in the future. He voiced understanding.

## 2020-06-04 ENCOUNTER — Encounter: Payer: Self-pay | Admitting: Medical Oncology

## 2020-06-04 NOTE — Progress Notes (Signed)
Received a call from Clarene Critchley- wife, to express her gratitude for helping her husband get his appointments scheduled with Dr. Tresa Moore @ [facility].  I told her I was happy to assist them and asked her to reach out me in the future if she has questions or concerns.

## 2020-10-28 ENCOUNTER — Other Ambulatory Visit: Payer: Self-pay | Admitting: Internal Medicine

## 2020-10-28 DIAGNOSIS — R1084 Generalized abdominal pain: Secondary | ICD-10-CM

## 2020-11-23 ENCOUNTER — Other Ambulatory Visit: Payer: Medicare Other

## 2020-11-26 ENCOUNTER — Ambulatory Visit
Admission: RE | Admit: 2020-11-26 | Discharge: 2020-11-26 | Disposition: A | Discharge status: Home / Self Care | Payer: Medicare Other | Source: Ambulatory Visit | Attending: Internal Medicine | Admitting: Internal Medicine

## 2020-11-26 DIAGNOSIS — R1084 Generalized abdominal pain: Secondary | ICD-10-CM
